# Patient Record
Sex: Male | Born: 1965 | Race: Black or African American | Hispanic: No | Marital: Married | Smoking: Never smoker
Health system: Southern US, Community
[De-identification: ages and names within clinical notes are randomized; demographics above are authoritative.]

---

## 2011-01-13 ENCOUNTER — Inpatient Hospital Stay (INDEPENDENT_AMBULATORY_CARE_PROVIDER_SITE_OTHER)
Admission: RE | Admit: 2011-01-13 | Discharge: 2011-01-13 | Disposition: A | Discharge status: Home / Self Care | Payer: 59 | Source: Ambulatory Visit | Attending: Emergency Medicine | Admitting: Emergency Medicine

## 2011-01-13 ENCOUNTER — Ambulatory Visit (INDEPENDENT_AMBULATORY_CARE_PROVIDER_SITE_OTHER): Payer: 59

## 2011-01-13 DIAGNOSIS — J189 Pneumonia, unspecified organism: Secondary | ICD-10-CM

## 2011-01-15 ENCOUNTER — Inpatient Hospital Stay (HOSPITAL_COMMUNITY)
Admission: RE | Admit: 2011-01-15 | Discharge: 2011-01-15 | Disposition: A | Discharge status: Home / Self Care | Payer: 59 | Source: Ambulatory Visit

## 2019-02-17 ENCOUNTER — Ambulatory Visit
Admission: RE | Admit: 2019-02-17 | Discharge: 2019-02-17 | Disposition: A | Discharge status: Home / Self Care | Payer: BLUE CROSS/BLUE SHIELD | Source: Ambulatory Visit | Attending: Sports Medicine | Admitting: Sports Medicine

## 2019-02-17 ENCOUNTER — Ambulatory Visit (INDEPENDENT_AMBULATORY_CARE_PROVIDER_SITE_OTHER): Payer: BLUE CROSS/BLUE SHIELD | Admitting: Sports Medicine

## 2019-02-17 ENCOUNTER — Other Ambulatory Visit: Payer: Self-pay

## 2019-02-17 VITALS — BP 130/80 | Ht 73.0 in | Wt 207.0 lb

## 2019-02-17 DIAGNOSIS — G8929 Other chronic pain: Secondary | ICD-10-CM

## 2019-02-17 DIAGNOSIS — M25562 Pain in left knee: Principal | ICD-10-CM

## 2019-02-17 NOTE — Progress Notes (Signed)
   Subjective:    Patient ID: Reginald Hardy, male    DOB: 1966-09-25, 53 y.o.   MRN: [id number]  HPI chief complaint: Left knee pain  Very pleasant 53 year old male comes in today complaining of longstanding intermittent left knee pain.  He remembers an injury 6 to 7 years ago where he stepped in a hole while working.  Had significant pain at the time but did not seek medical treatment.  Since then, he has had intermittent pain primarily along the lateral aspect of his knee.  It does not seem to interfere with his quality of life too much.  Pain is not bad enough that he has taken any sort of pain medication.  He has not seen a physician prior to me for this problem.  He describes the pain as toothache in quality.  He denies locking or catching but does describe some popping in the knee.  Also some feelings of instability.  He does endorse some mild swelling.  He also notices an inability to completely flex the left knee.  No prior knee surgeries.  Pain does not radiate.  Heat and ice do seem to help.  Past medical history reviewed Medications reviewed Allergies reviewed    Review of Systems    As above Objective:   Physical Exam  Well-developed, well-nourished.  No acute distress.  Awake alert and oriented x3.  Vital signs reviewed.  Left knee: Range of motion is 0 to 115 degrees.  Trace effusion.  He does have bony hypertrophy consistent with probable DJD.  Slight valgus deformity with standing.  No tenderness to palpation along medial or lateral joint lines.  Knee is stable to valgus and varus stressing.  Negative anterior drawer, negative posterior drawer.  No palpable Baker's cyst.  Negative Thessaly's.  Neurovascularly intact distally.  X-rays of the left knee show advanced degenerative changes.  There is bone-on-bone DJD in the lateral compartment on the flexion view.  Nothing acute.     Assessment & Plan:   Advanced left knee DJD  Definitive treatment is a total knee  arthroplasty but the patient is certainly not symptomatic enough at this point to consider that.  In fact, he is fine with no treatment at all currently.  He understands that we can try oral medications or cortisone injections down the road if his symptoms warrant.  We did educate him in quad strengthening exercises and I explained to him the importance of keeping the quadriceps muscle strong to help support the knee joint.  He understands.  He will limit his activity based on symptoms and will follow up with me as needed.

## 2019-02-18 ENCOUNTER — Encounter: Payer: Self-pay | Admitting: Sports Medicine

## 2020-10-09 IMAGING — CR LEFT KNEE - COMPLETE 4+ VIEW
4 series · 4 of 4 positions shown · non-contrast
Comparison: None.

CLINICAL DATA: 52-year-old male with chronic left knee pain

EXAM:
LEFT KNEE - COMPLETE 4+ VIEW

[w knee ap left]
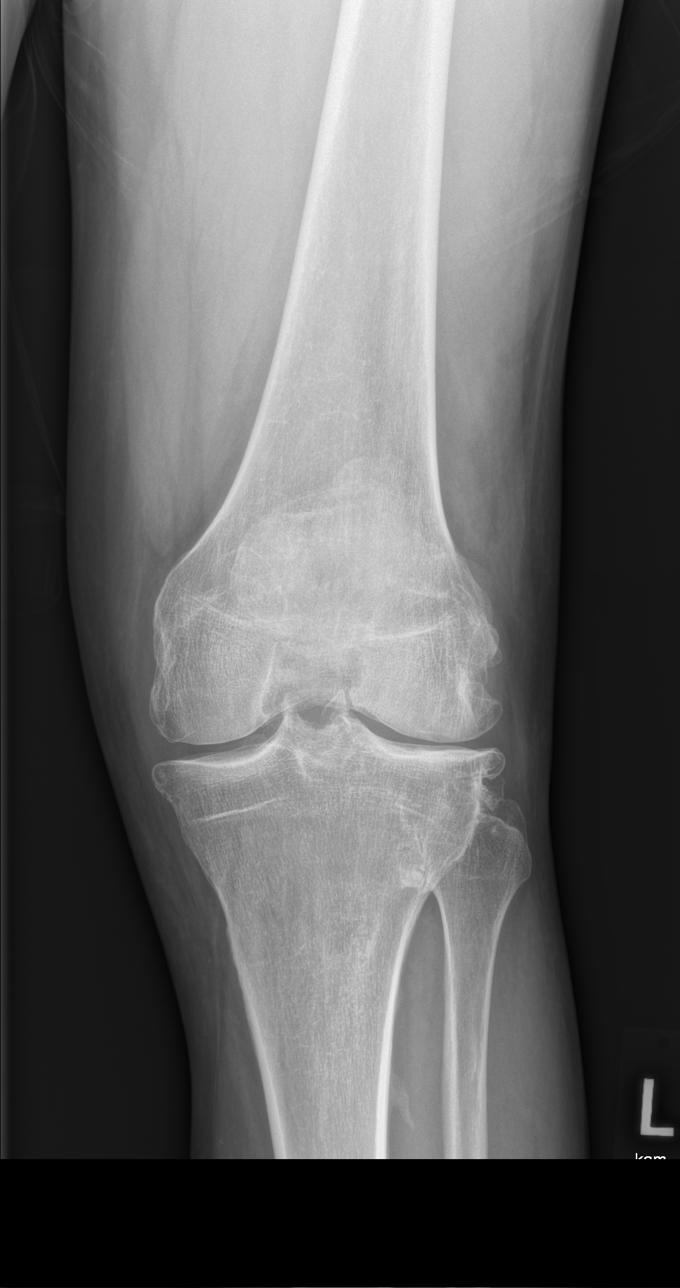

[w knee lat left]
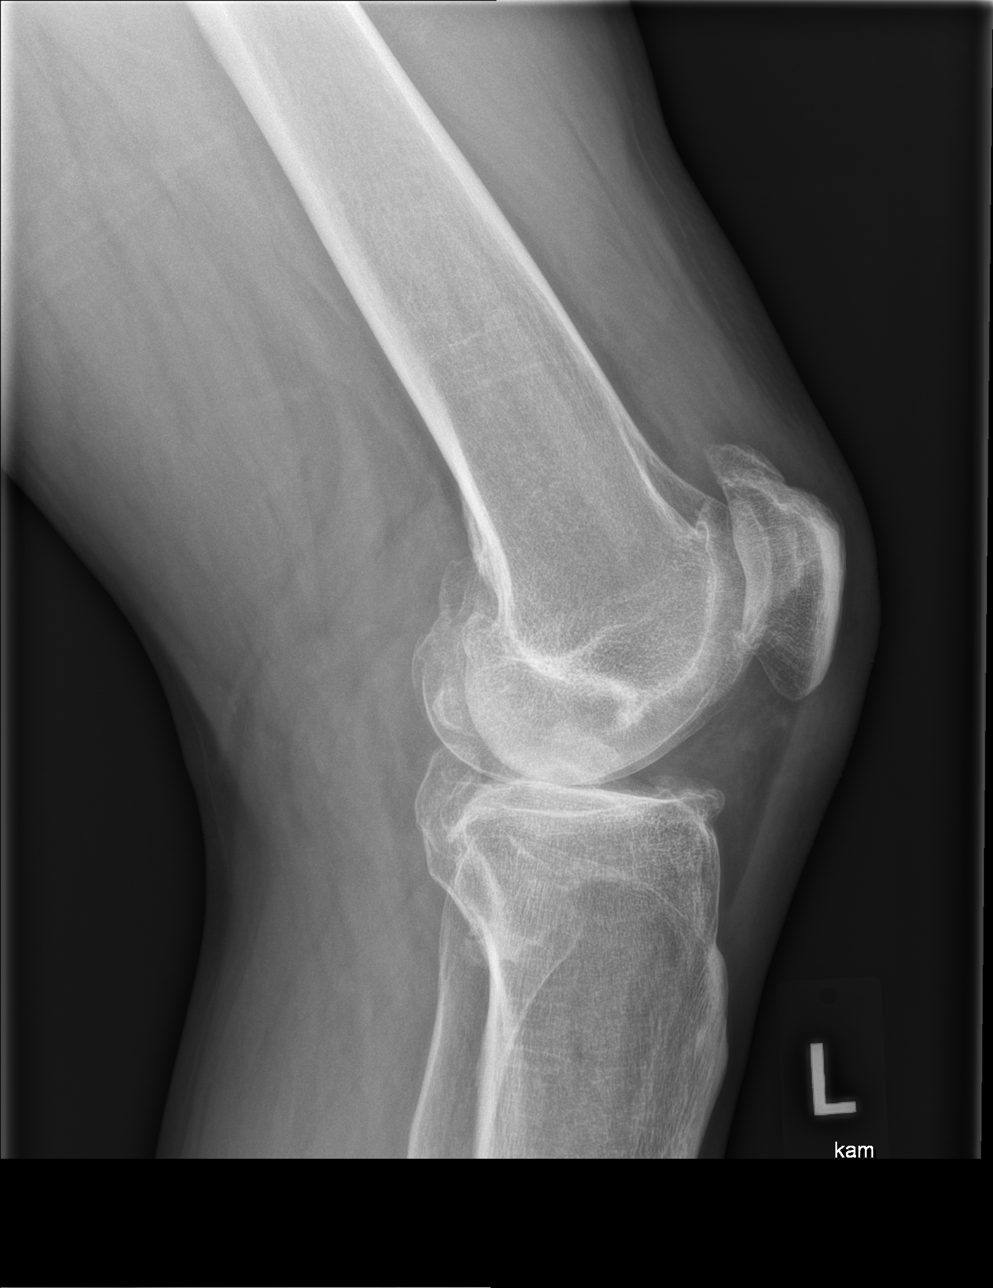

[w knee tunnel pa left]
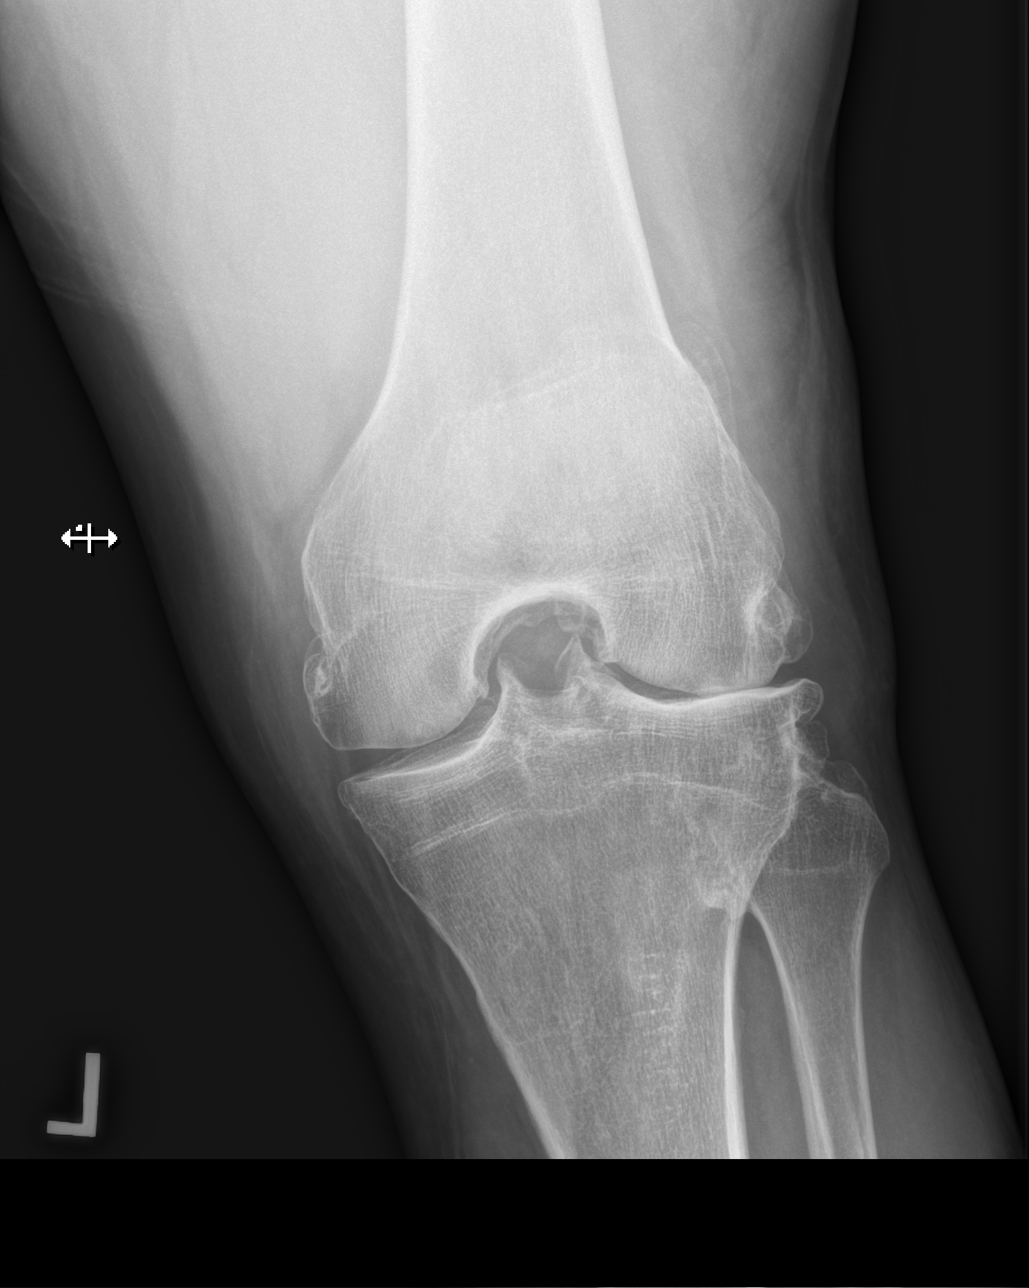

[x knee sunrise left]
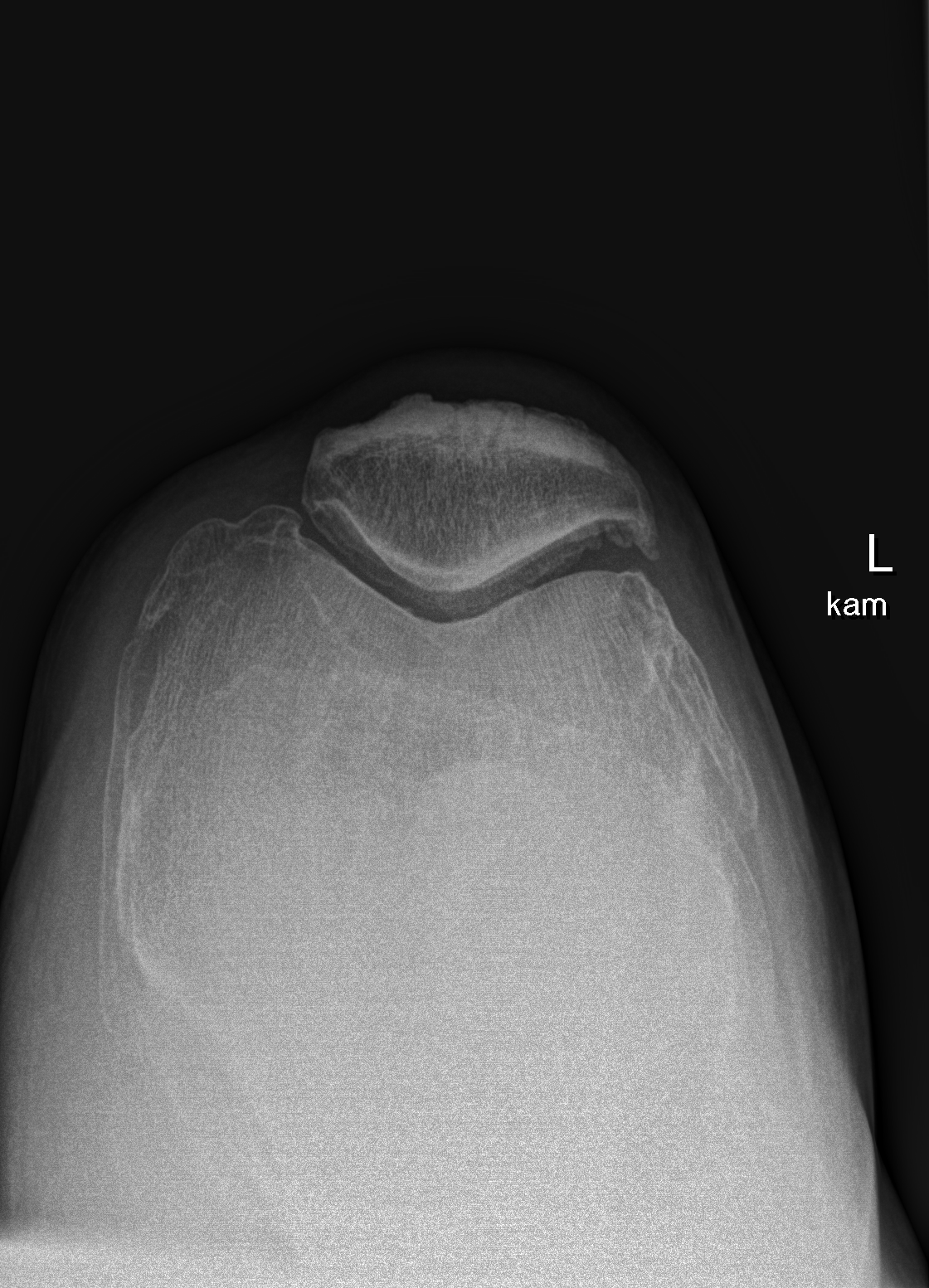

[4 of 4 positions shown; findings below may reference images not displayed]

FINDINGS: No acute displaced fracture.  No joint effusion.

Lateral greater than medial joint space narrowing with eburnation of
the lateral femoral condyle and the tibial plateau, osteophyte
formation. Degenerative changes of the trochlea. Degenerative
changes at the patellofemoral joint.
IMPRESSION: No acute bony abnormality.

Moderate to advanced tricompartmental osteoarthritis, worst on the
lateral compartment

## 2021-04-08 ENCOUNTER — Encounter: Payer: Self-pay | Admitting: Emergency Medicine

## 2021-04-08 ENCOUNTER — Other Ambulatory Visit: Payer: Self-pay

## 2021-04-08 ENCOUNTER — Ambulatory Visit
Admission: EM | Admit: 2021-04-08 | Discharge: 2021-04-08 | Disposition: A | Discharge status: Home / Self Care | Payer: BC Managed Care – PPO | Attending: Nurse Practitioner | Admitting: Nurse Practitioner

## 2021-04-08 DIAGNOSIS — M109 Gout, unspecified: Secondary | ICD-10-CM

## 2021-04-08 MED ORDER — DEXAMETHASONE SODIUM PHOSPHATE 10 MG/ML IJ SOLN
10.0000 mg | Freq: Once | INTRAMUSCULAR | Status: AC
  Administered 2021-04-08: 10 mg via INTRAMUSCULAR

## 2021-04-08 MED ORDER — PREDNISONE 5 MG (21) PO TBPK: ORAL_TABLET | ORAL | 0 refills | Status: AC

## 2021-04-08 MED ORDER — NAPROXEN 500 MG PO TABS: 500.0000 mg | ORAL_TABLET | Freq: Two times a day (BID) | ORAL | 0 refills | Status: AC

## 2021-04-08 NOTE — ED Triage Notes (Signed)
Patient c/o right foot swelling x 3 days, no apparent injury, applying ice.  Patient has taken Aleve.

## 2021-04-08 NOTE — ED Provider Notes (Signed)
[facility]    CSN: [id number] Arrival date & time: 04/08/21  0809      History   Chief Complaint Chief Complaint  Patient presents with  . Foot Pain    HPI Reginald Hardy is a 55 y.o. male.   Subjective:   Reginald Hardy is a 55 y.o. male who presents with possible gout. Pain is located in the right MTP(s): 1st, and has been present for 3 days. Pain is described as aching and sharp, and is constant. He also endorses erythema and swelling. He denies any crepitation, decreased range of motion, deformity, effusion or instability. Patient denies any injury.The patient has tried OTC medications (aleve) and ice for pain, with minimal relief. No prior history of gout.   The following portions of the patient's history were reviewed and updated as appropriate: allergies, current medications, past family history, past medical history, past social history, past surgical history and problem list.       History reviewed. No pertinent past medical history.  There are no problems to display for this patient.   History reviewed. No pertinent surgical history.     Home Medications    Prior to Admission medications   Medication Sig Start Date End Date Taking? Authorizing Provider  naproxen (NAPROSYN) 500 MG tablet Take 1 tablet (500 mg total) by mouth 2 (two) times daily for 10 days. 04/08/21 04/18/21 Yes Lurline Idol, FNP  predniSONE (STERAPRED UNI-PAK 21 TAB) 5 MG (21) TBPK tablet Take as directed 04/08/21  Yes Lurline Idol, FNP    Family History No family history on file.  Social History Social History   Tobacco Use  . Smoking status: Never Smoker  . Smokeless tobacco: Never Used     Allergies   Patient has no known allergies.   Review of Systems Review of Systems  Constitutional: Negative for fever.  Gastrointestinal: Negative for nausea and vomiting.  Musculoskeletal: Positive for joint swelling. Negative for gait problem.  Skin: Negative  for rash and wound.  Neurological: Negative for numbness.  All other systems reviewed and are negative.    Physical Exam Triage Vital Signs ED Triage Vitals  Enc Vitals Group     BP 04/08/21 0817 (!) 143/75     Pulse Rate 04/08/21 0817 62     Resp --      Temp 04/08/21 0817 98.2 F (36.8 C)     Temp Source 04/08/21 0817 Oral     SpO2 04/08/21 0817 98 %     Weight --      Height --      Head Circumference --      Peak Flow --      Pain Score 04/08/21 0819 5     Pain Loc --      Pain Edu? --      Excl. in GC? --    No data found.  Updated Vital Signs BP (!) 143/75 (BP Location: Left Arm)   Pulse 62   Temp 98.2 F (36.8 C) (Oral)   SpO2 98%   Visual Acuity Right Eye Distance:   Left Eye Distance:   Bilateral Distance:    Right Eye Near:   Left Eye Near:    Bilateral Near:     Physical Exam Vitals reviewed.  Constitutional:      Appearance: Normal appearance.  HENT:     Head: Normocephalic.  Cardiovascular:     Rate and Rhythm: Normal rate and regular rhythm.  Pulmonary:  Effort: Pulmonary effort is normal.     Breath sounds: Normal breath sounds.  Musculoskeletal:        General: Normal range of motion.     Cervical back: Normal range of motion and neck supple.     Right foot: Normal range of motion and normal capillary refill. No deformity.       Feet:  Skin:    General: Skin is warm and dry.  Neurological:     General: No focal deficit present.     Mental Status: He is alert and oriented to person, place, and time.  Psychiatric:        Mood and Affect: Mood normal.      UC Treatments / Results  Labs (all labs ordered are listed, but only abnormal results are displayed) Labs Reviewed - No data to display  EKG   Radiology No results found.  Procedures Procedures (including critical care time)  Medications Ordered in UC Medications  dexamethasone (DECADRON) injection 10 mg (10 mg Intramuscular Given 04/08/21 0828)    Initial  Impression / Assessment and Plan / UC Course  I have reviewed the triage vital signs and the nursing notes.  Pertinent labs & imaging results that were available during my care of the patient were reviewed by me and considered in my medical decision making (see chart for details).    55 yo male presenting with tenderness, swelling and redness to the first metatarsophalangeal joint of the right foot. Physical exam suggestive of acute gout. Education about gout causes and treatment discussed. Decadron IM given in clinic. Prednisone and naproxen per medication orders.   Today's evaluation has revealed no signs of a dangerous process. Discussed diagnosis with patient and/or guardian. Patient and/or guardian aware of their diagnosis, possible red flag symptoms to watch out for and need for close follow up. Patient and/or guardian understands verbal and written discharge instructions. Patient and/or guardian comfortable with plan and disposition.  Patient and/or guardian has a clear mental status at this time, good insight into illness (after discussion and teaching) and has clear judgment to make decisions regarding their care  This care was provided during an unprecedented National Emergency due to the Novel Coronavirus (COVID-19) pandemic. COVID-19 infections and transmission risks place heavy strains on healthcare resources.  As this pandemic evolves, our facility, providers, and staff strive to respond fluidly, to remain operational, and to provide care relative to available resources and information. Outcomes are unpredictable and treatments are without well-defined guidelines. Further, the impact of COVID-19 on all aspects of urgent care, including the impact to patients seeking care for reasons other than COVID-19, is unavoidable during this national emergency. At this time of the global pandemic, management of patients has significantly changed, even for non-COVID positive patients given high local and  regional COVID volumes at this time requiring high healthcare system and resource utilization. The standard of care for management of both COVID suspected and non-COVID suspected patients continues to change rapidly at the local, regional, national, and global levels. This patient was worked up and treated to the best available but ever changing evidence and resources available at this current time.   Documentation was completed with the aid of voice recognition software. Transcription may contain typographical errors.   Final Clinical Impressions(s) / UC Diagnoses   Final diagnoses:  Acute gout involving toe of right foot, unspecified cause     Discharge Instructions     Take medications as prescribed. Please review the information attached regarding  gout. Follow-up with PCP if no improvement or recurrence of symptoms.     ED Prescriptions    Medication Sig Dispense Auth. Provider   naproxen (NAPROSYN) 500 MG tablet Take 1 tablet (500 mg total) by mouth 2 (two) times daily for 10 days. 20 tablet Faruq Rosenberger, [place], FNP   predniSONE (STERAPRED UNI-PAK 21 TAB) 5 MG (21) TBPK tablet Take as directed 21 tablet Lurline Idol, FNP     PDMP not reviewed this encounter.   Lurline Idol, [place] 04/08/21 0930

## 2021-04-08 NOTE — Discharge Instructions (Addendum)
Take medications as prescribed. Please review the information attached regarding gout. Follow-up with PCP if no improvement or recurrence of symptoms.

## 2021-06-05 ENCOUNTER — Other Ambulatory Visit (HOSPITAL_COMMUNITY): Payer: Self-pay
